# Patient Record
Sex: Male | Born: 1995 | Race: White | Hispanic: No | Marital: Single | Smoking: Never smoker
Health system: Southern US, Community
[De-identification: ages and names within clinical notes are randomized; demographics above are authoritative.]

---
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
## 2016-06-09 ENCOUNTER — Encounter: Payer: Self-pay | Admitting: Family Medicine

## 2016-06-09 ENCOUNTER — Ambulatory Visit (INDEPENDENT_AMBULATORY_CARE_PROVIDER_SITE_OTHER): Payer: BLUE CROSS/BLUE SHIELD | Admitting: Family Medicine

## 2016-06-09 VITALS — BP 125/82 | HR 79 | Temp 98.0°F | Resp 14

## 2016-06-09 DIAGNOSIS — J029 Acute pharyngitis, unspecified: Secondary | ICD-10-CM

## 2016-06-09 NOTE — Progress Notes (Signed)
Patient presents today with history of a few days of sore throat, nasal congestion, cough. Patient admits to having a swollen lymph node on the right the last 2 days. He denies history of mono in the past. He denies any extreme fatigue or loss of appetite. He denies any abdominal pain, chest pain, shortness of breath.  ROS: Negative except mentioned above. Vitals as per Epic GENERAL: NAD HEENT: mild pharyngeal erythema, no exudate, no erythema of TMs, mild right cervical LAD RESP: CTA B CARD: RRR ABD: Positive bowel sounds, nontender, no organomegaly appreciated NEURO: CN II-XII grossly intact   A/P: Pharyngitis, right cervical lymphadenopathy - Will do CBC and monospot test, no athletic activity for now, rest, hydration, Tylenol/Ibuprofen when necessary, oral antihistamine when necessary. Seek medical attention if symptoms persist or worsen as discussed.

## 2016-06-10 LAB — CBC WITH DIFFERENTIAL/PLATELET
Basophils Absolute: 0 10*3/uL (ref 0.0–0.2)
Basos: 0 %
EOS (ABSOLUTE): 0.5 10*3/uL — AB (ref 0.0–0.4)
Eos: 6 %
Hematocrit: 44.9 % (ref 37.5–51.0)
Hemoglobin: 15.8 g/dL (ref 13.0–17.7)
Immature Grans (Abs): 0 10*3/uL (ref 0.0–0.1)
Immature Granulocytes: 0 %
LYMPHS ABS: 1.8 10*3/uL (ref 0.7–3.1)
Lymphs: 26 %
MCH: 31 pg (ref 26.6–33.0)
MCHC: 35.2 g/dL (ref 31.5–35.7)
MCV: 88 fL (ref 79–97)
MONOS ABS: 0.6 10*3/uL (ref 0.1–0.9)
Monocytes: 9 %
NEUTROS ABS: 4.1 10*3/uL (ref 1.4–7.0)
Neutrophils: 59 %
PLATELETS: 205 10*3/uL (ref 150–379)
RBC: 5.09 x10E6/uL (ref 4.14–5.80)
RDW: 12.5 % (ref 12.3–15.4)
WBC: 7 10*3/uL (ref 3.4–10.8)

## 2016-06-10 LAB — MONONUCLEOSIS SCREEN: MONO SCREEN: NEGATIVE

## 2017-04-06 ENCOUNTER — Ambulatory Visit (INDEPENDENT_AMBULATORY_CARE_PROVIDER_SITE_OTHER): Payer: BLUE CROSS/BLUE SHIELD | Admitting: Family Medicine

## 2017-04-06 ENCOUNTER — Encounter: Payer: Self-pay | Admitting: Family Medicine

## 2017-04-06 DIAGNOSIS — S83411A Sprain of medial collateral ligament of right knee, initial encounter: Secondary | ICD-10-CM

## 2017-04-06 MED ORDER — NAPROXEN 500 MG PO TABS: 500.0000 mg | ORAL_TABLET | Freq: Two times a day (BID) | ORAL | 0 refills | Status: AC

## 2017-05-28 ENCOUNTER — Encounter: Payer: Self-pay | Admitting: Family Medicine

## 2017-05-28 ENCOUNTER — Ambulatory Visit (INDEPENDENT_AMBULATORY_CARE_PROVIDER_SITE_OTHER): Payer: BLUE CROSS/BLUE SHIELD | Admitting: Family Medicine

## 2017-05-28 VITALS — BP 129/79 | HR 86 | Temp 97.6°F | Resp 14

## 2017-05-28 DIAGNOSIS — H1089 Other conjunctivitis: Secondary | ICD-10-CM

## 2017-05-28 MED ORDER — POLYMYXIN B-TRIMETHOPRIM 10000-0.1 UNIT/ML-% OP SOLN: OPHTHALMIC | 0 refills | Status: AC

## 2017-05-28 NOTE — Progress Notes (Signed)
Patient presents today with symptoms of mattiness to both eyes and redness x 1 day. Patient states that he has had symptoms of nasal drainage and itchy watery eyes for a few days prior to this. He also has had some diarrhea. He denies any sore throat, shortness of breath, wheezing, nasal congestion, cough, fever, chills or abdominal pain.He denies any vision problems. He has been taking his allergy medication daily. Denies wearing contact lenses.  ROS: Negative except mentioned above. Vitals as per Epic. 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

## 2017-08-12 IMAGING — CR DG CHEST 2V
1 series · 2 of 2 positions shown · non-contrast
Comparison: None.

CLINICAL DATA: 20-year-old male with chest pain

EXAM:
CHEST  2 VIEW

[Series 1: dg chest 2 view · 0.14mm/px · 2 of 2 slices shown]
[im 1/2]
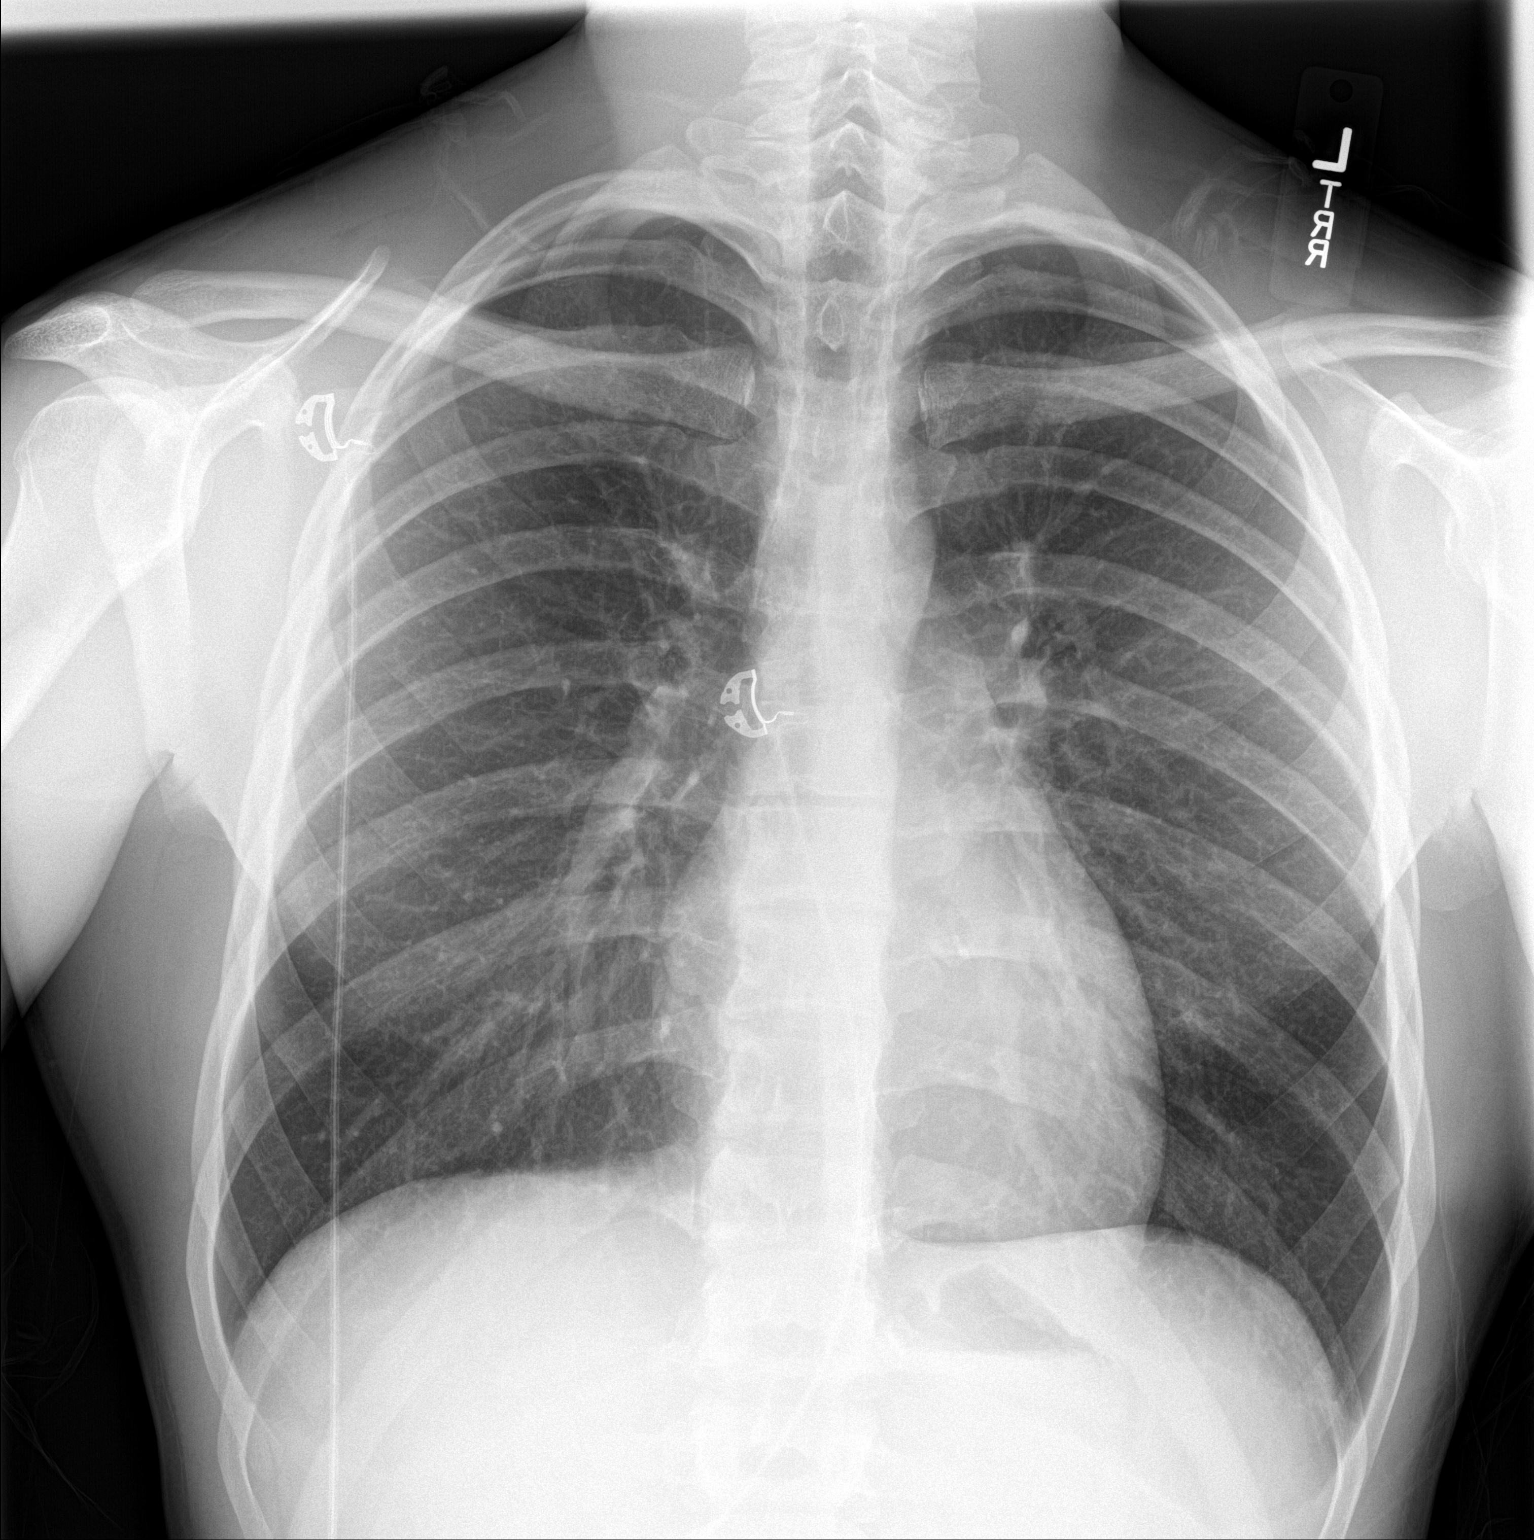
[im 2/2]
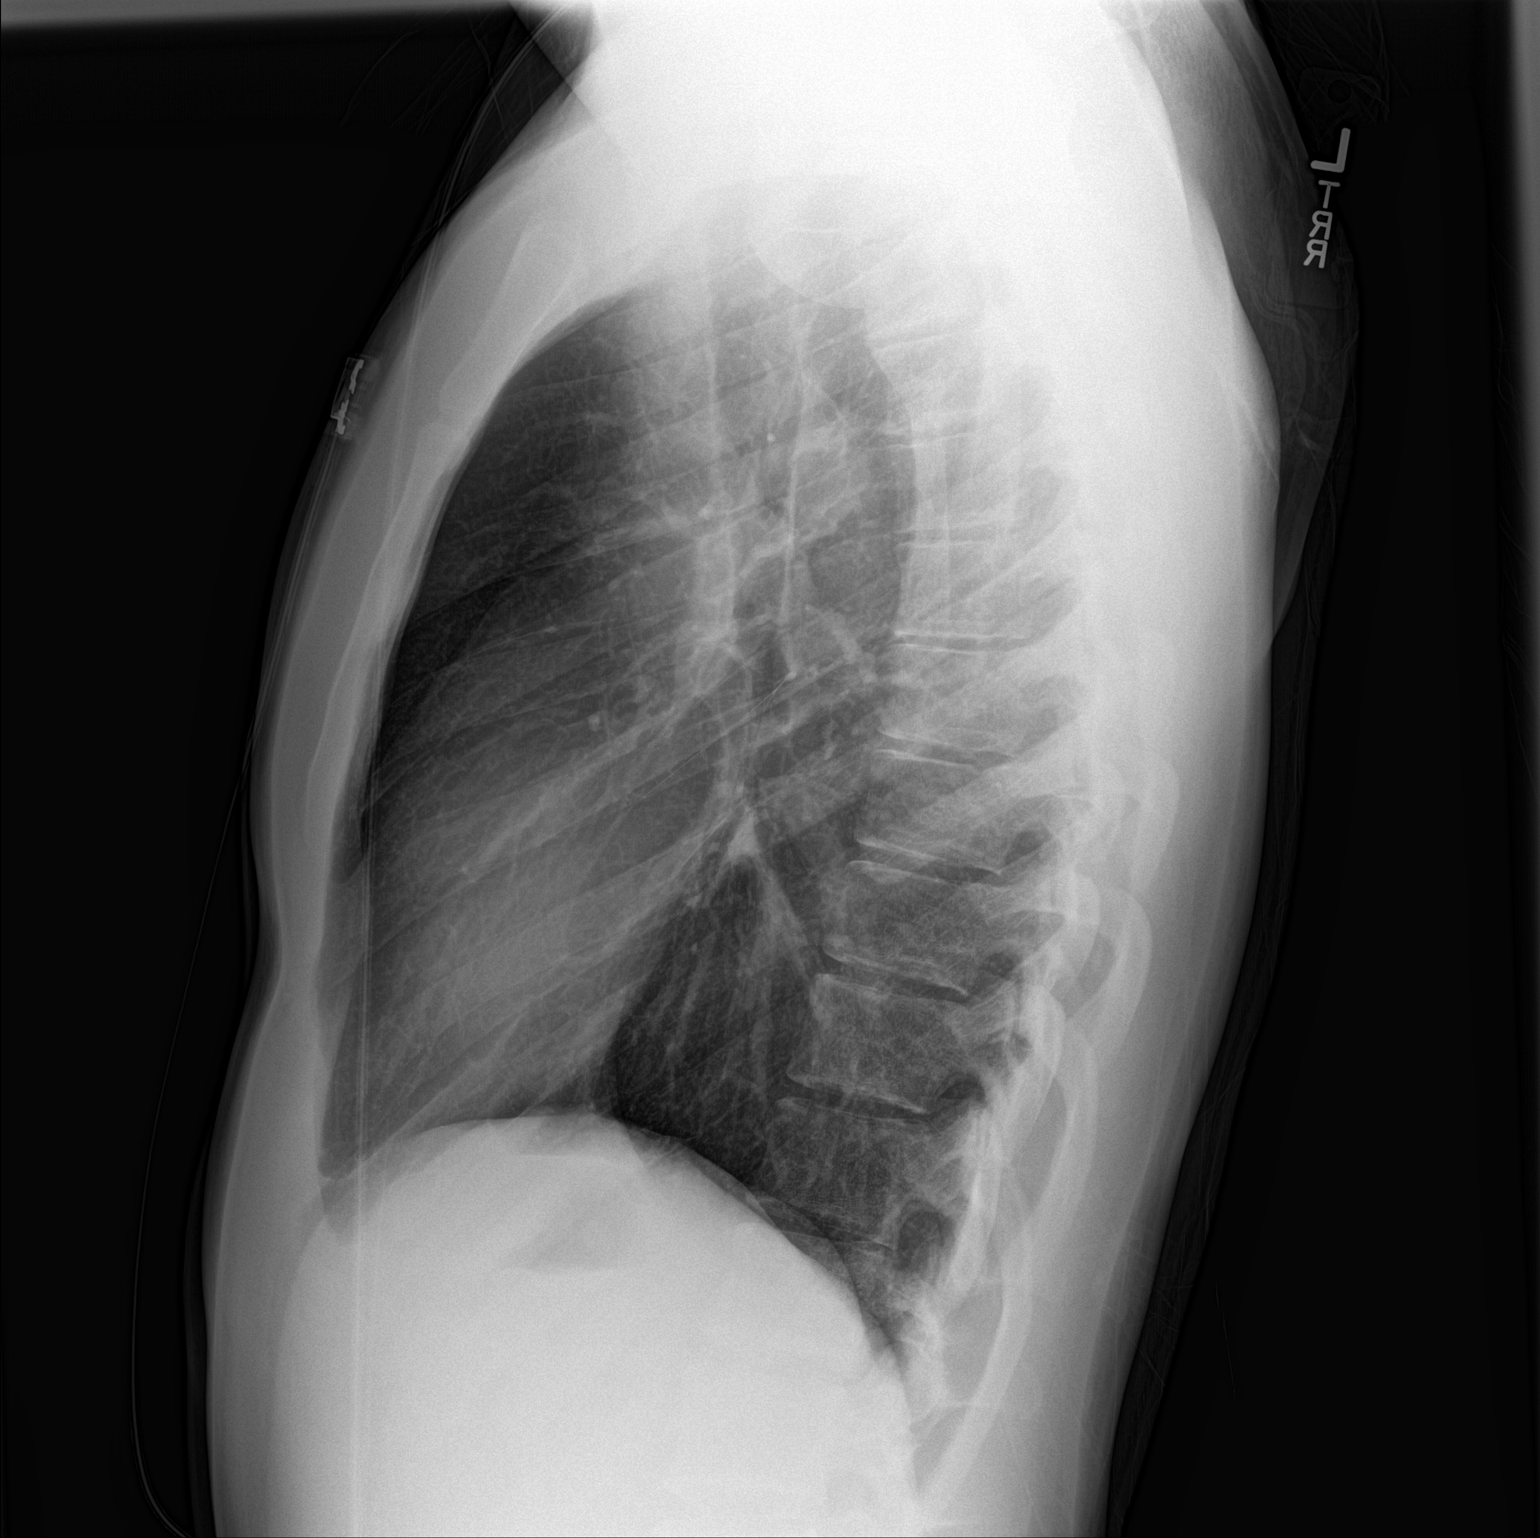

[2 of 2 positions shown; findings below may reference images not displayed]

FINDINGS: The heart size and mediastinal contours are within normal limits.
Both lungs are clear. The visualized skeletal structures are
unremarkable.
IMPRESSION: No active cardiopulmonary disease.

## 2017-08-13 IMAGING — CT CT ABD-PELV W/ CM
2 of 4 series · 15 of 46 positions shown, 17 images · IV contrast (APPLIED)
Comparison: None.

CLINICAL DATA: Right lower quadrant abdominal pain for a week.
Left-sided chest pain.

EXAM:
CT ABDOMEN AND PELVIS WITH CONTRAST
TECHNIQUE: Multidetector CT imaging of the abdomen and pelvis was performed
using the standard protocol following bolus administration of
intravenous contrast.
CONTRAST:  100mL [id number] IOPAMIDOL ([id number]) INJECTION 61%

[Series 2: axial st · axial · 0.67mm/px · z∈[-962,-528]mm · 12 of 99 slices shown, 14 images]
[im 8/99  soft-tissue]
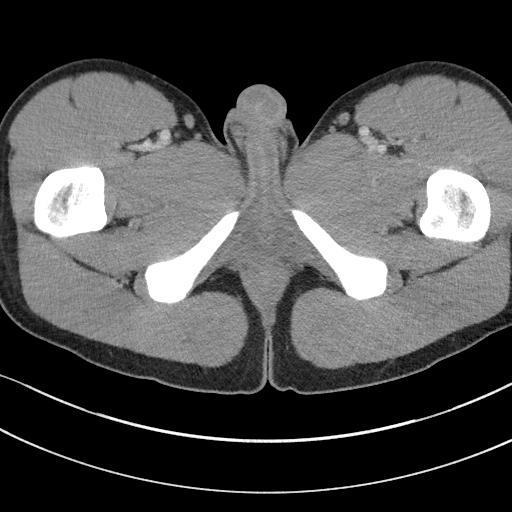
[im 8/99  bone]
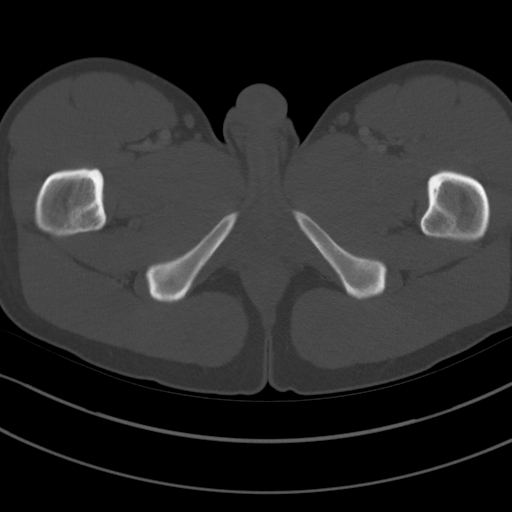
[im 16/99  soft-tissue]
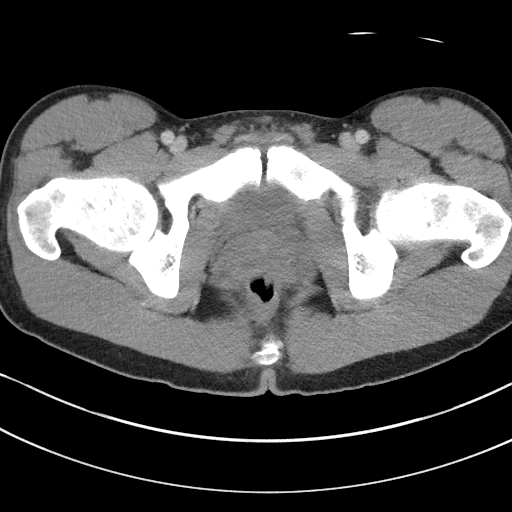
[im 24/99  soft-tissue]
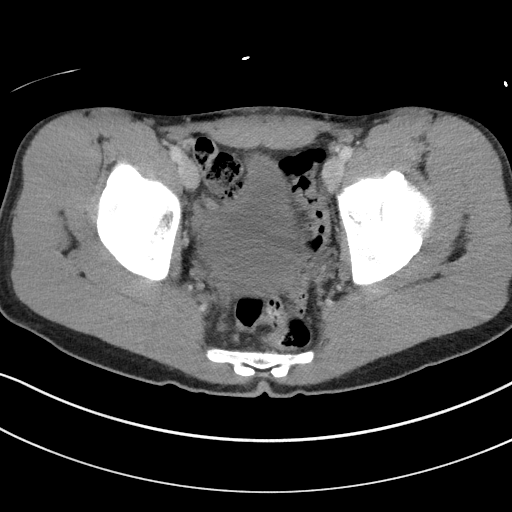
[im 32/99  soft-tissue]
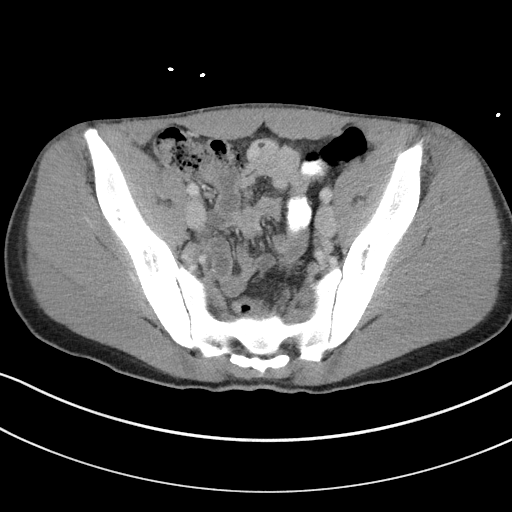
[im 40/99  soft-tissue]
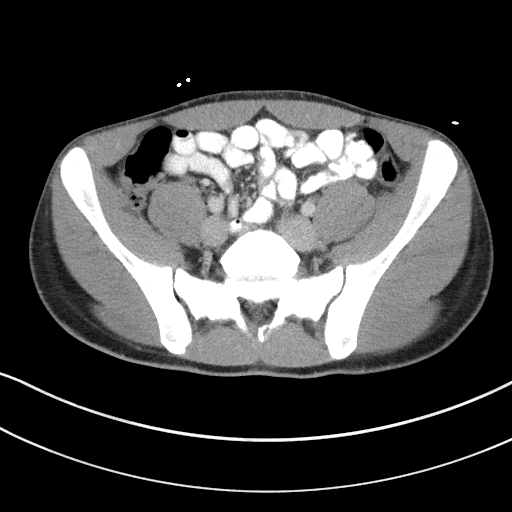
[im 48/99  soft-tissue]
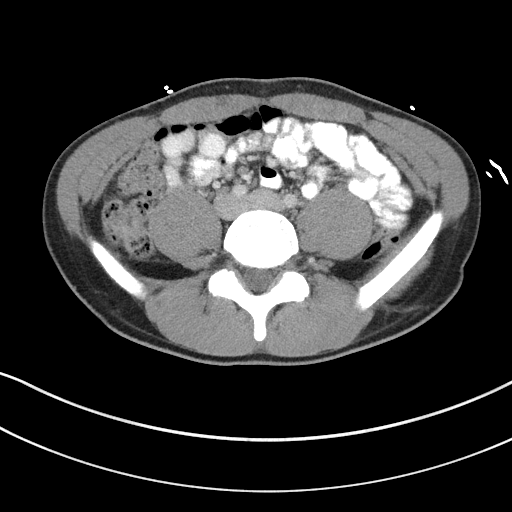
[im 55/99  soft-tissue]
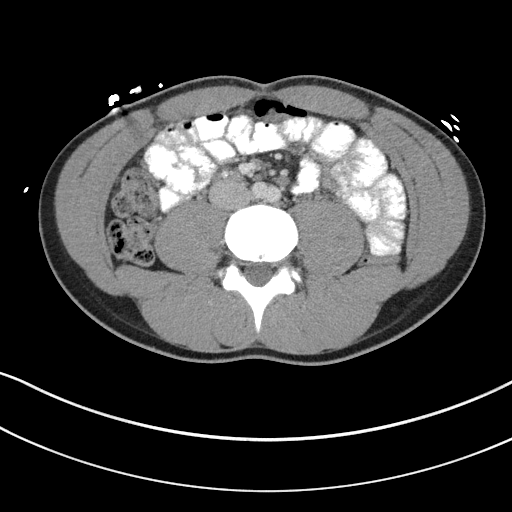
[im 63/99  soft-tissue]
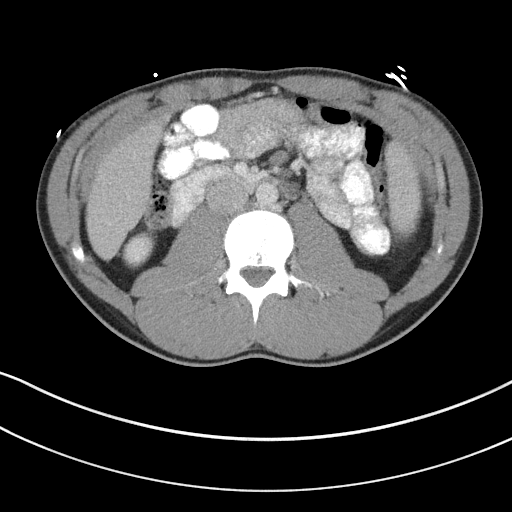
[im 71/99  soft-tissue]
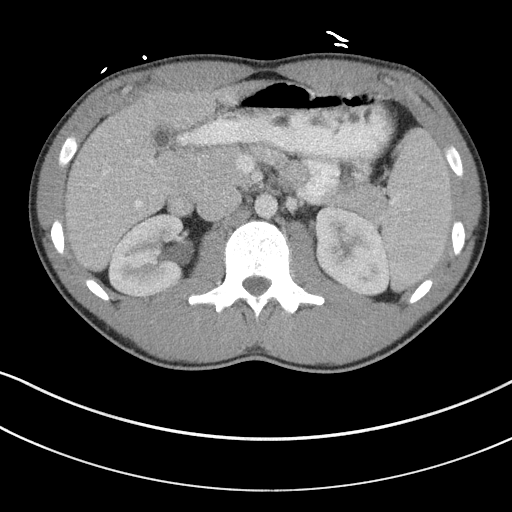
[im 71/99  bone]
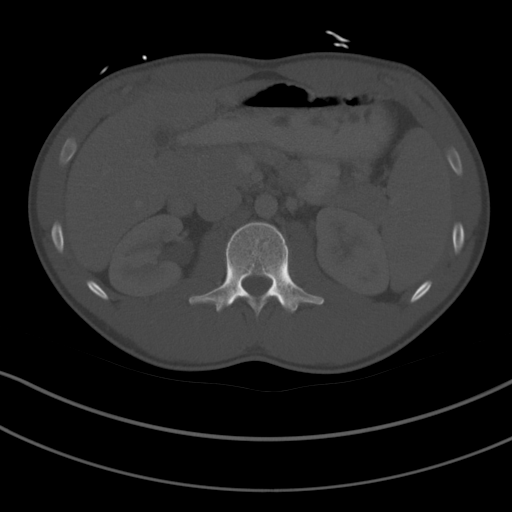
[im 79/99  soft-tissue]
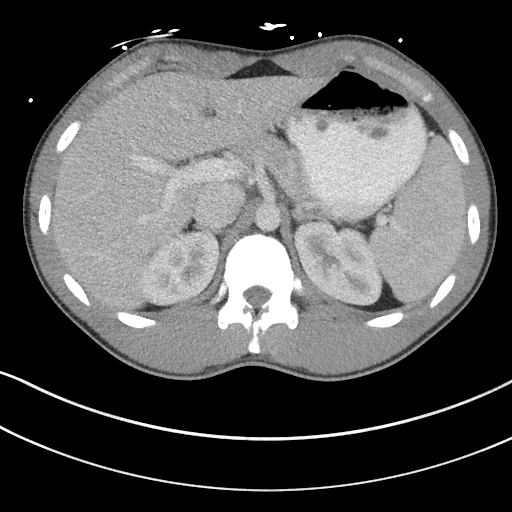
[im 87/99  soft-tissue]
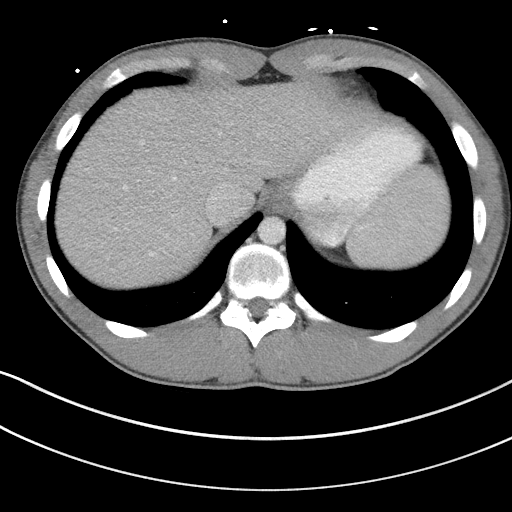
[im 95/99  soft-tissue]
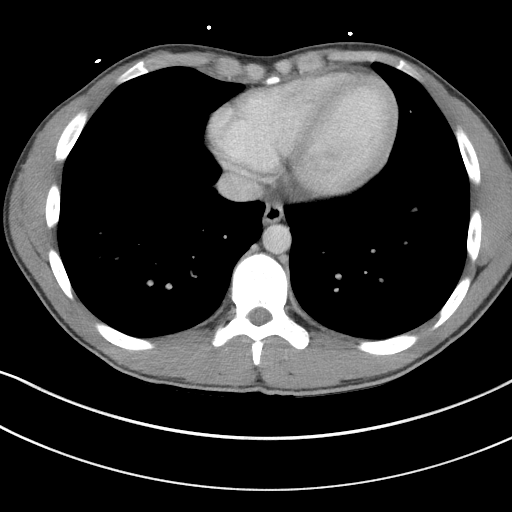

[Series 5: coronal st · coronal · 0.68mm/px · 3 of 81 slices shown]
[im 27/81  soft-tissue]
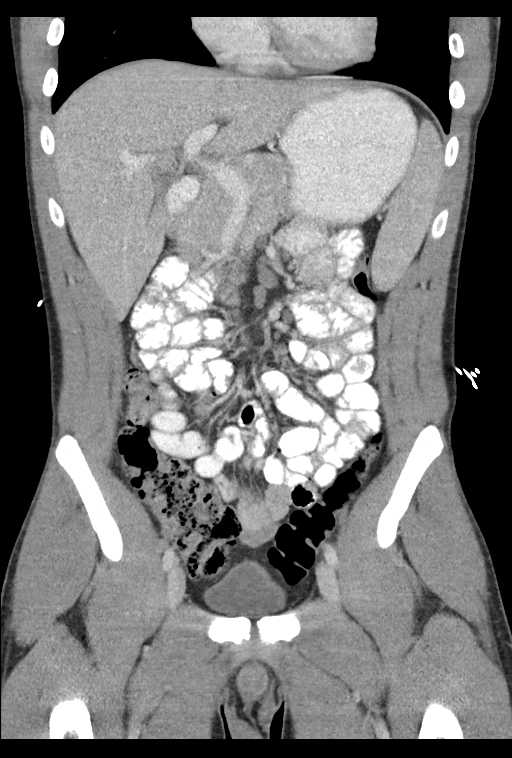
[im 36/81  soft-tissue]
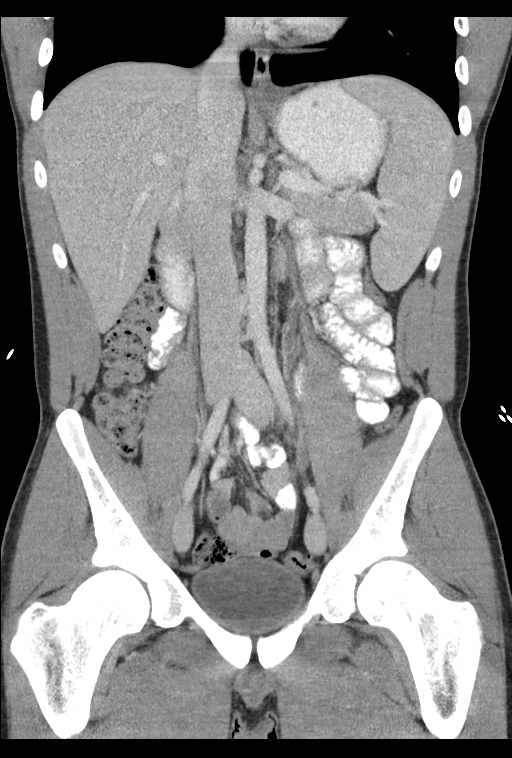
[im 45/81  soft-tissue]
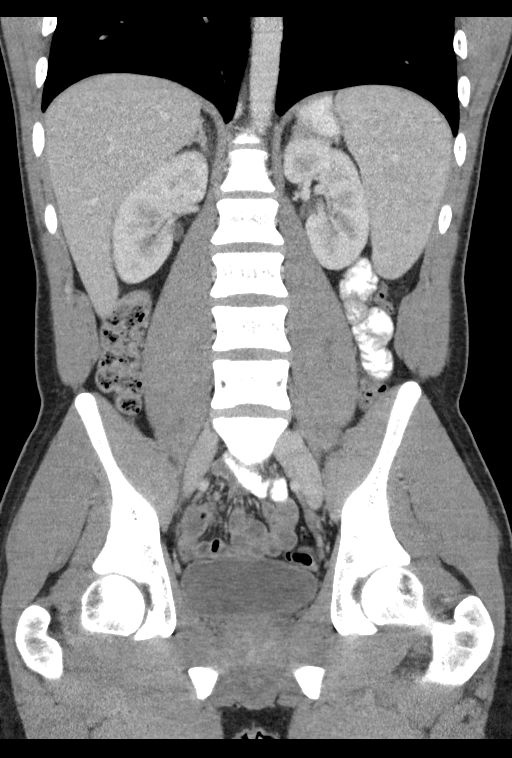

[15 of 46 positions shown; findings below may reference images not displayed]

FINDINGS: Lower chest: Lung bases are clear.

Hepatobiliary: No focal liver abnormality is seen. No gallstones,
gallbladder wall thickening, or biliary dilatation.

Pancreas: Unremarkable. No pancreatic ductal dilatation or
surrounding inflammatory changes.

Spleen: Normal in size without focal abnormality.

Adrenals/Urinary Tract: Adrenal glands are unremarkable. Kidneys are
normal, without renal calculi, focal lesion, or hydronephrosis.
Bladder is unremarkable.

Stomach/Bowel: Stomach is within normal limits. Appendix is not
identified. No evidence of bowel wall thickening, distention, or
inflammatory changes.

Vascular/Lymphatic: No significant vascular findings are present. No
enlarged abdominal or pelvic lymph nodes.

Reproductive: Prostate gland is not enlarged.

Other: No abdominal wall hernia or abnormality. No abdominopelvic
ascites.

Musculoskeletal: No acute or significant osseous findings.
IMPRESSION: No acute process demonstrated in the abdomen or pelvis. No evidence
of bowel obstruction or inflammation. Appendix is not specifically
identified.

## 2017-11-19 NOTE — Progress Notes (Signed)
With symptoms of right knee pain.  Patient states that he has had the symptoms for the last 2 weeks.  He states that his pain is mostly on the medial aspect of the knee.  He denies any significant effusion.  He believes that he stressed the inner part of his knee with baseball activity.  He was put in a brace by his athletic trainer.  He denies any significant problems with his knee in the past.  He denies any clicking or popping of the knee.  He has not been taking any medication consistently for his knee pain.  ROS: Negative except mentioned above. Vitals as per Epic. GENERAL: NAD MSK: R Knee -no effusion, full range of motion, mild tenderness along medial aspect of the knee, no joint line tenderness, negative Lachman, negative drawer, negative McMurray, mild discomfort and minimal laxity with valgus stress, N/V intact NEURO: CN II-XII grossly intact   A/P: Right knee injury -discussed likely MCL sprain, should see Dr. Ardine Eng the next time he is on campus, will not order MRI at this time unless Dr. Ardine Eng would like one, continue wearing brace, NSAIDs as needed, follow-up with me if any acute concerns.  Discussed plan above with athletic trainer.
